# Patient Record
Sex: Male | Born: 1995 | Race: Black or African American | Hispanic: No | Marital: Single | Smoking: Former smoker
Health system: Southern US, Community
[De-identification: ages and names within clinical notes are randomized; demographics above are authoritative.]

## PROBLEM LIST (undated history)

## (undated) HISTORY — PX: HERNIA REPAIR: SHX51

---
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
## 2017-08-12 IMAGING — DX DG FINGER LITTLE 2+V*R*
3 series · 3 of 3 positions shown · non-contrast
Comparison: None.

CLINICAL DATA: Right hand pain.  Football injury.

EXAM:
RIGHT LITTLE FINGER 2+V

[finger ap]
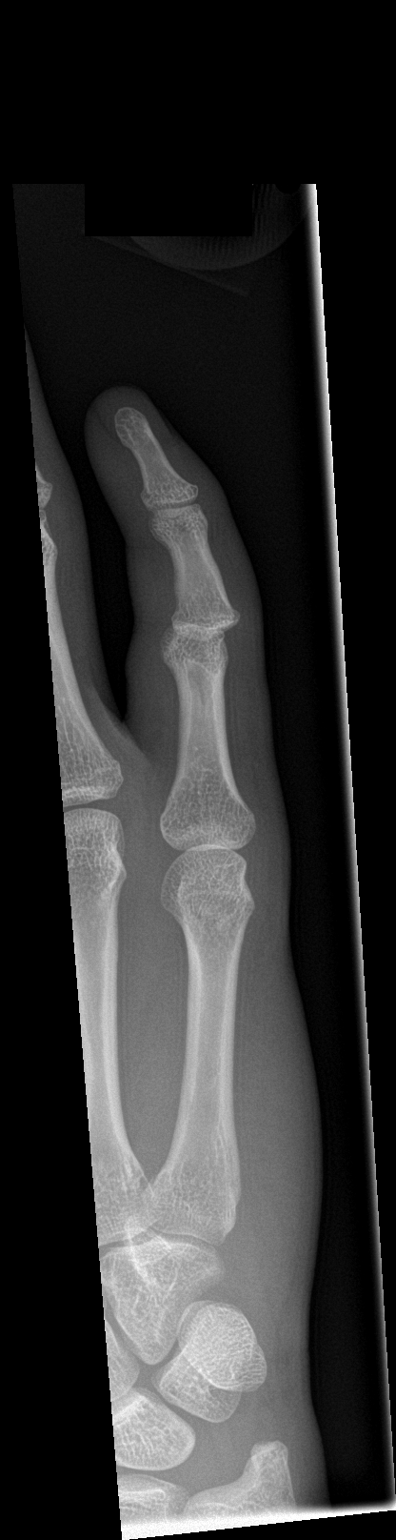

[finger obl]
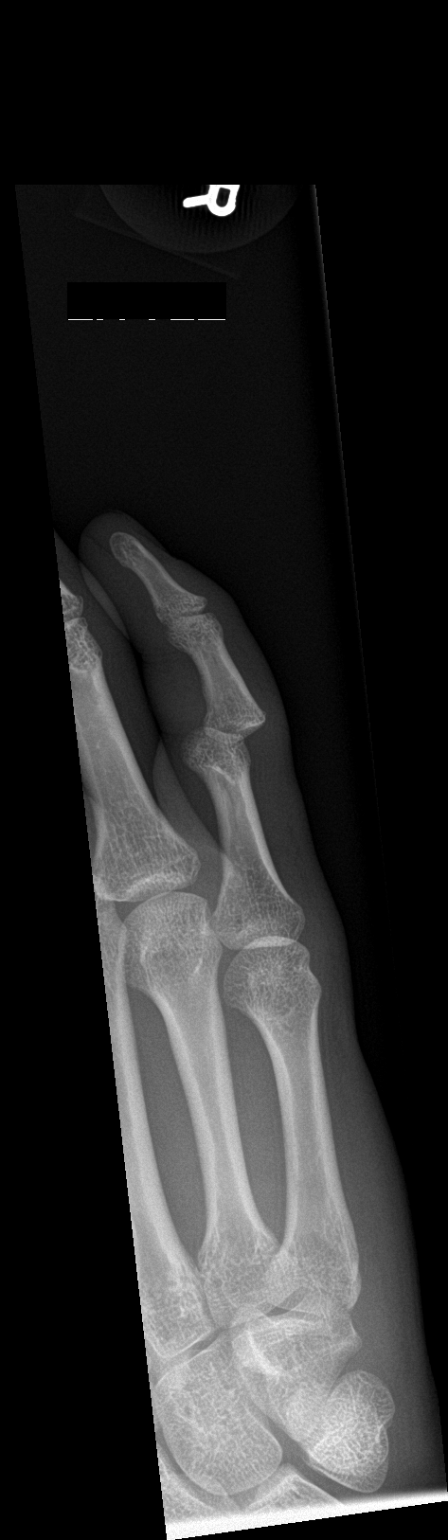

[finger lat]
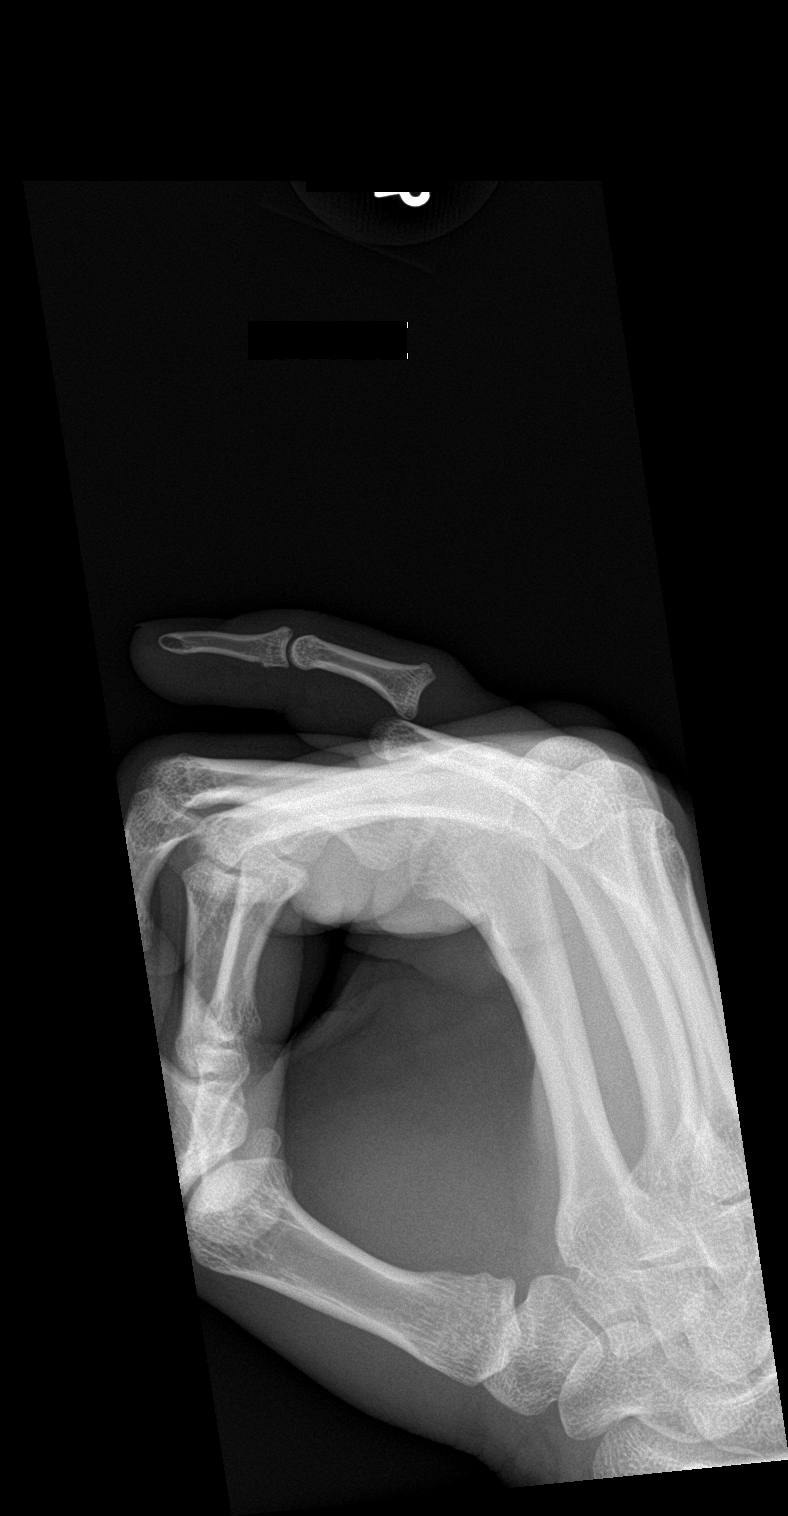

[3 of 3 positions shown; findings below may reference images not displayed]

FINDINGS: There is dorsal dislocation at the proximal interphalangeal joint of
the right fifth digit with associated soft tissue swelling. There is
approximately 4 mm of overriding at the dislocation site. No
fracture is identified.
IMPRESSION: Dorsal dislocation of the right fifth proximal interphalangeal joint
with associated soft tissue swelling. No associated fracture.

## 2019-05-01 ENCOUNTER — Other Ambulatory Visit: Payer: Self-pay

## 2019-05-01 DIAGNOSIS — Z20822 Contact with and (suspected) exposure to covid-19: Secondary | ICD-10-CM

## 2019-05-03 LAB — NOVEL CORONAVIRUS, NAA: SARS-CoV-2, NAA: NOT DETECTED
